# Patient Record
Sex: Male | Born: 1985 | Race: White | Hispanic: No | Smoking: Never smoker
Health system: Southern US, Community
[De-identification: ages and names within clinical notes are randomized; demographics above are authoritative.]

---

## 2016-07-11 ENCOUNTER — Emergency Department
Admission: EM | Admit: 2016-07-11 | Discharge: 2016-07-11 | Disposition: A | Discharge status: Other Acute Inpatient Hospital | Payer: Medicare Other | Attending: Emergency Medicine | Admitting: Emergency Medicine

## 2016-07-11 ENCOUNTER — Emergency Department: Payer: Medicare Other

## 2016-07-11 ENCOUNTER — Encounter: Payer: Self-pay | Admitting: Emergency Medicine

## 2016-07-11 DIAGNOSIS — Z23 Encounter for immunization: Secondary | ICD-10-CM | POA: Diagnosis not present

## 2016-07-11 DIAGNOSIS — Y929 Unspecified place or not applicable: Secondary | ICD-10-CM | POA: Diagnosis not present

## 2016-07-11 DIAGNOSIS — W260XXA Contact with knife, initial encounter: Secondary | ICD-10-CM | POA: Insufficient documentation

## 2016-07-11 DIAGNOSIS — S66809A Unspecified injury of other specified muscles, fascia and tendons at wrist and hand level, unspecified hand, initial encounter: Secondary | ICD-10-CM

## 2016-07-11 DIAGNOSIS — S6992XA Unspecified injury of left wrist, hand and finger(s), initial encounter: Secondary | ICD-10-CM | POA: Diagnosis present

## 2016-07-11 DIAGNOSIS — S66107A Unspecified injury of flexor muscle, fascia and tendon of left little finger at wrist and hand level, initial encounter: Secondary | ICD-10-CM | POA: Diagnosis not present

## 2016-07-11 DIAGNOSIS — Y939 Activity, unspecified: Secondary | ICD-10-CM | POA: Insufficient documentation

## 2016-07-11 DIAGNOSIS — S66105A Unspecified injury of flexor muscle, fascia and tendon of left ring finger at wrist and hand level, initial encounter: Secondary | ICD-10-CM | POA: Diagnosis not present

## 2016-07-11 DIAGNOSIS — Y999 Unspecified external cause status: Secondary | ICD-10-CM | POA: Diagnosis not present

## 2016-07-11 LAB — CBC
HCT: 44.8 % (ref 40.0–52.0)
Hemoglobin: 15.3 g/dL (ref 13.0–18.0)
MCH: 30.4 pg (ref 26.0–34.0)
MCHC: 34.2 g/dL (ref 32.0–36.0)
MCV: 88.9 fL (ref 80.0–100.0)
PLATELETS: 339 10*3/uL (ref 150–440)
RBC: 5.04 MIL/uL (ref 4.40–5.90)
RDW: 13.2 % (ref 11.5–14.5)
WBC: 26 10*3/uL — AB (ref 3.8–10.6)

## 2016-07-11 LAB — BASIC METABOLIC PANEL
Anion gap: 10 (ref 5–15)
BUN: 7 mg/dL (ref 6–20)
CALCIUM: 9.1 mg/dL (ref 8.9–10.3)
CO2: 24 mmol/L (ref 22–32)
Chloride: 102 mmol/L (ref 101–111)
Creatinine, Ser: 0.91 mg/dL (ref 0.61–1.24)
GFR calc Af Amer: >60 mL/min (ref >60)
Glucose, Bld: 137 mg/dL — ABNORMAL HIGH (ref 65–99)
POTASSIUM: 3.7 mmol/L (ref 3.5–5.1)
SODIUM: 136 mmol/L (ref 135–145)

## 2016-07-11 MED ORDER — MORPHINE SULFATE (PF) 4 MG/ML IV SOLN
INTRAVENOUS | Status: AC
  Administered 2016-07-11: 4 mg via INTRAVENOUS
  Filled 2016-07-11: qty 1

## 2016-07-11 MED ORDER — ONDANSETRON HCL 4 MG/2ML IJ SOLN
4.0000 mg | Freq: Once | INTRAMUSCULAR | Status: AC
  Administered 2016-07-11: 4 mg via INTRAVENOUS

## 2016-07-11 MED ORDER — CEFAZOLIN SODIUM-DEXTROSE 1-4 GM/50ML-% IV SOLN
INTRAVENOUS | Status: AC
  Administered 2016-07-11: 1 g via INTRAVENOUS
  Filled 2016-07-11: qty 50

## 2016-07-11 MED ORDER — TETANUS-DIPHTH-ACELL PERTUSSIS 5-2.5-18.5 LF-MCG/0.5 IM SUSP
INTRAMUSCULAR | Status: AC
  Administered 2016-07-11: 0.5 mL via INTRAMUSCULAR
  Filled 2016-07-11: qty 0.5

## 2016-07-11 MED ORDER — MORPHINE SULFATE (PF) 4 MG/ML IV SOLN
4.0000 mg | Freq: Once | INTRAVENOUS | Status: AC
  Administered 2016-07-11: 4 mg via INTRAVENOUS

## 2016-07-11 MED ORDER — CEFAZOLIN SODIUM-DEXTROSE 1-4 GM/50ML-% IV SOLN
1.0000 g | Freq: Once | INTRAVENOUS | Status: AC
  Administered 2016-07-11: 1 g via INTRAVENOUS

## 2016-07-11 MED ORDER — TETANUS-DIPHTH-ACELL PERTUSSIS 5-2.5-18.5 LF-MCG/0.5 IM SUSP
0.5000 mL | Freq: Once | INTRAMUSCULAR | Status: AC
  Administered 2016-07-11: 0.5 mL via INTRAMUSCULAR

## 2016-07-11 MED ORDER — ONDANSETRON HCL 4 MG/2ML IJ SOLN
INTRAMUSCULAR | Status: AC
Start: 2016-07-11 — End: 2016-07-11
  Administered 2016-07-11: 4 mg via INTRAVENOUS
  Filled 2016-07-11: qty 2

## 2016-07-11 MED ORDER — LIDOCAINE HCL (PF) 1 % IJ SOLN
INTRAMUSCULAR | Status: AC
  Administered 2016-07-11: 15 mL via INTRADERMAL
  Filled 2016-07-11: qty 15

## 2016-07-11 MED ORDER — LIDOCAINE HCL (PF) 1 % IJ SOLN
15.0000 mL | Freq: Once | INTRAMUSCULAR | Status: AC
  Administered 2016-07-11: 15 mL via INTRADERMAL
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
## 2016-07-11 NOTE — ED Notes (Signed)
Called Dr. Merlyn Lot, answering service [phone]

## 2016-07-11 NOTE — ED Notes (Signed)
Called [facility], for consult and transfer [phone]

## 2016-07-11 NOTE — ED Notes (Signed)
Called [facility] for consult 7:53a
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
## 2016-07-11 NOTE — ED Notes (Signed)
Officer Margo Aye called c com for transport for patient going to [facility]

## 2016-07-11 NOTE — ED Notes (Signed)
Called [facility] for consult with hand surgery at 8:25

## 2016-07-11 NOTE — ED Notes (Signed)
Magistrate at bedside

## 2018-02-09 IMAGING — DX DG HAND COMPLETE 3+V*L*
3 series · 3 of 3 positions shown · non-contrast
Comparison: No prior.

CLINICAL DATA: Laceration.

EXAM:
LEFT HAND - COMPLETE 3+ VIEW

[hand ap]
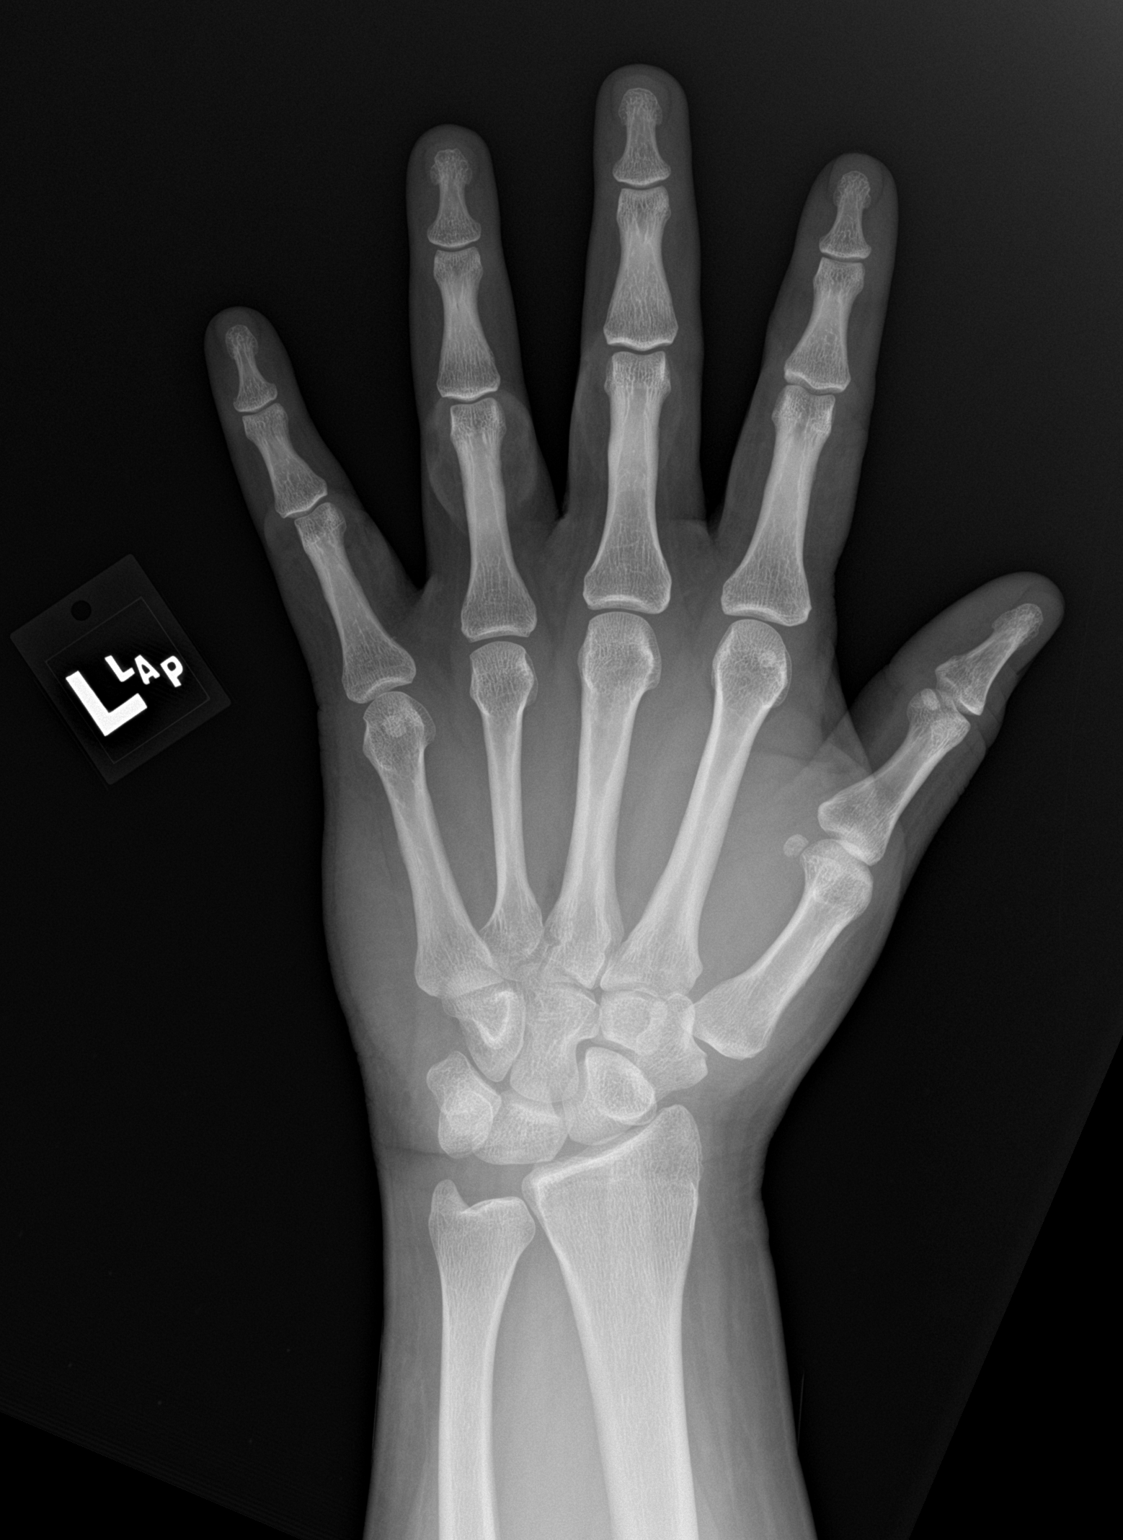

[hand obl]
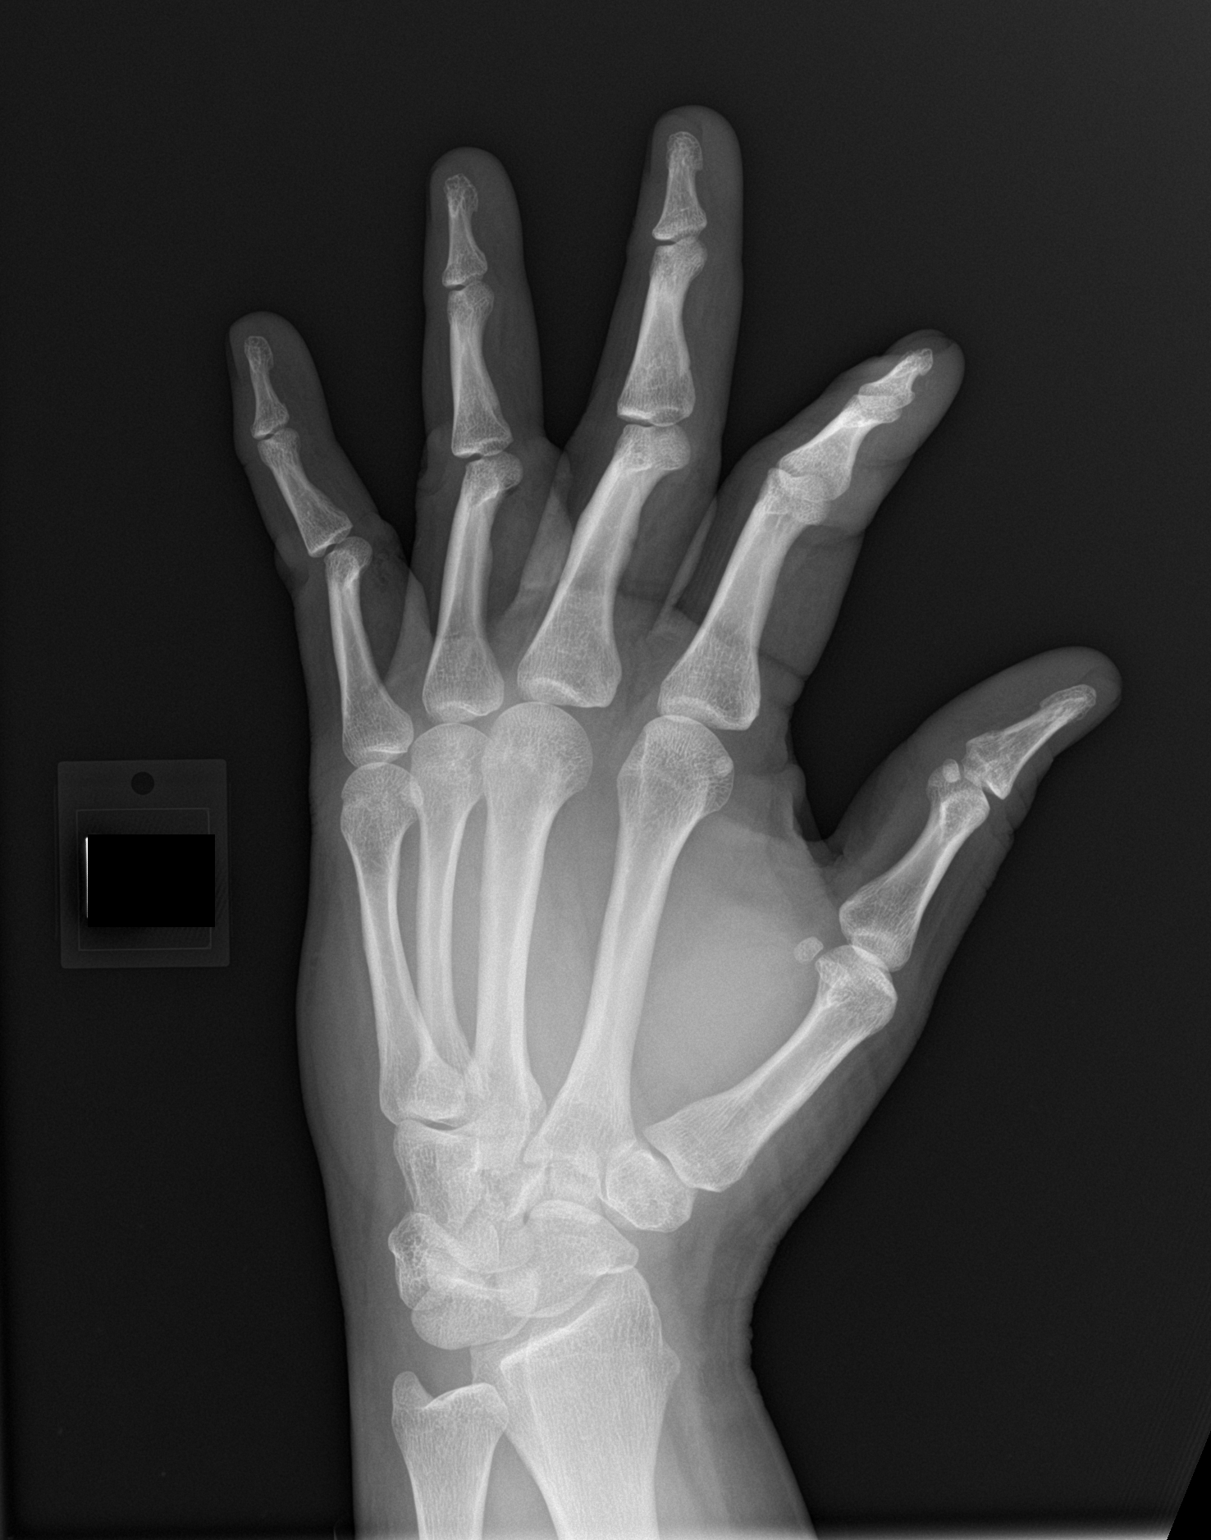

[hand lat]
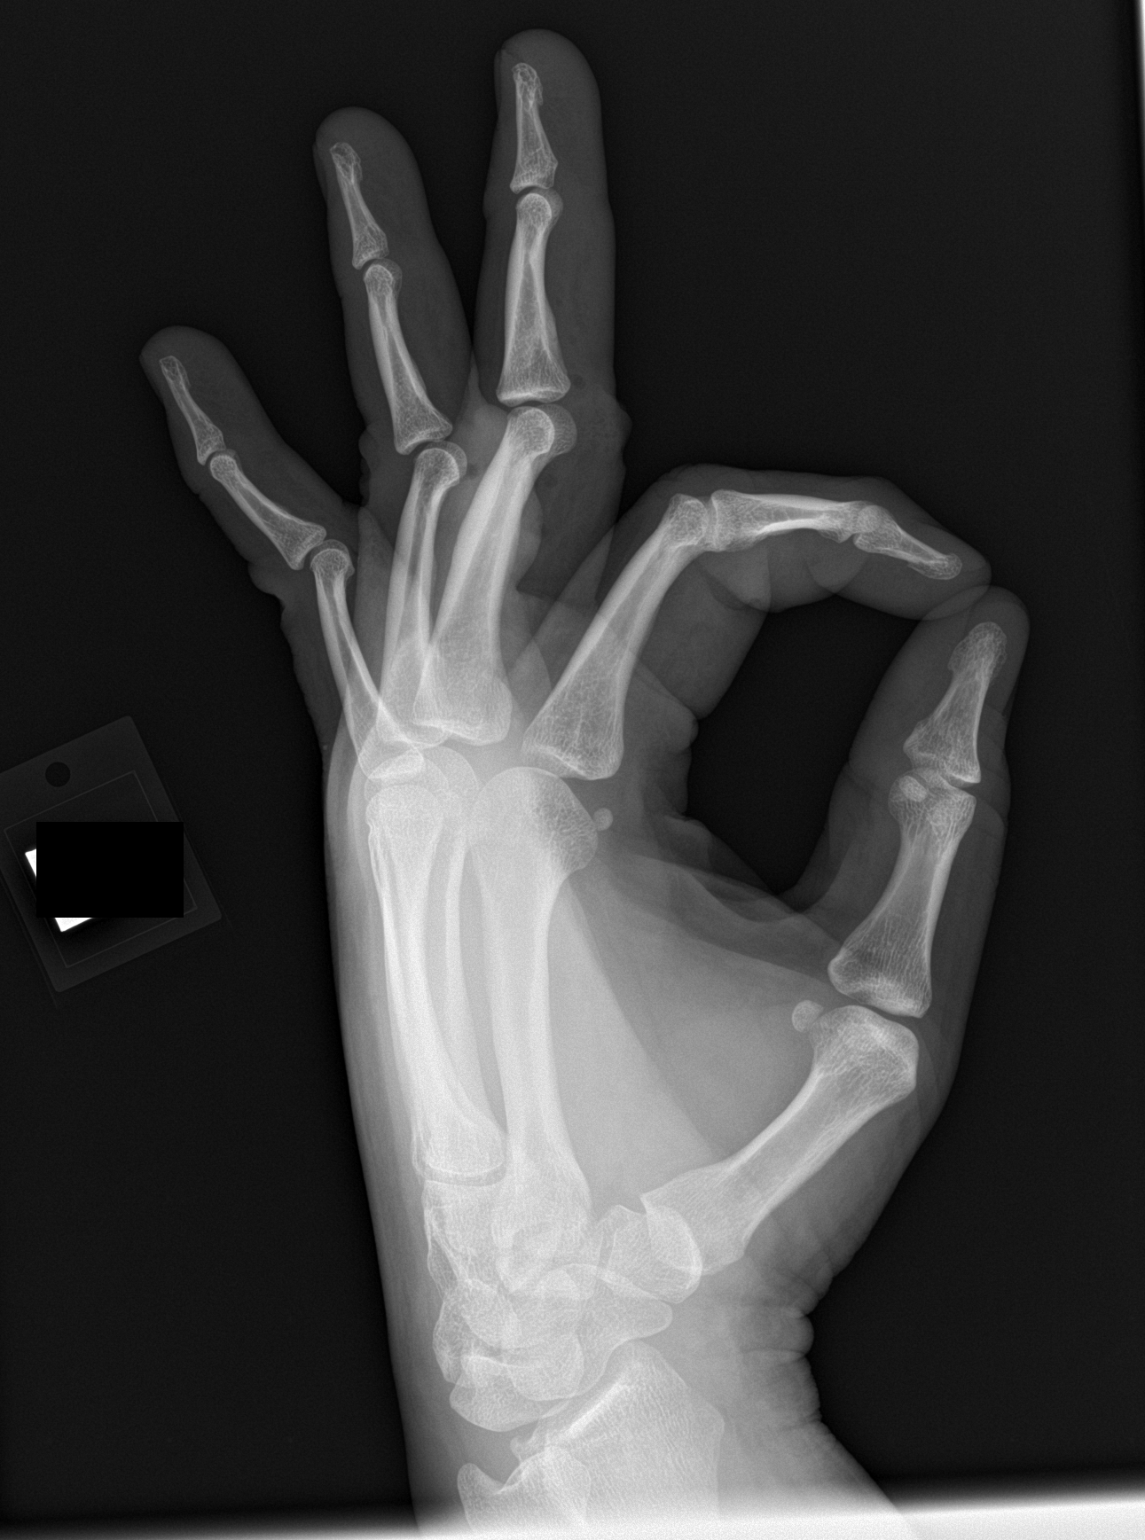

[3 of 3 positions shown; findings below may reference images not displayed]

FINDINGS: Soft tissue lacerations noted over the left third, fourth, and fifth
digits. No radiopaque foreign body. No acute bony abnormality
identified. No evidence of fracture or dislocation.
IMPRESSION: 1. Soft tissue lacerations of the left third, fourth, and fifth
digits. No radiopaque foreign body.

2. No acute bony or joint abnormality.

## 2018-02-09 IMAGING — DX DG HAND COMPLETE 3+V*R*
3 series · 3 of 3 positions shown · non-contrast
Comparison: No prior.

CLINICAL DATA: Laceration

EXAM:
RIGHT HAND - COMPLETE 3+ VIEW

[hand ap]
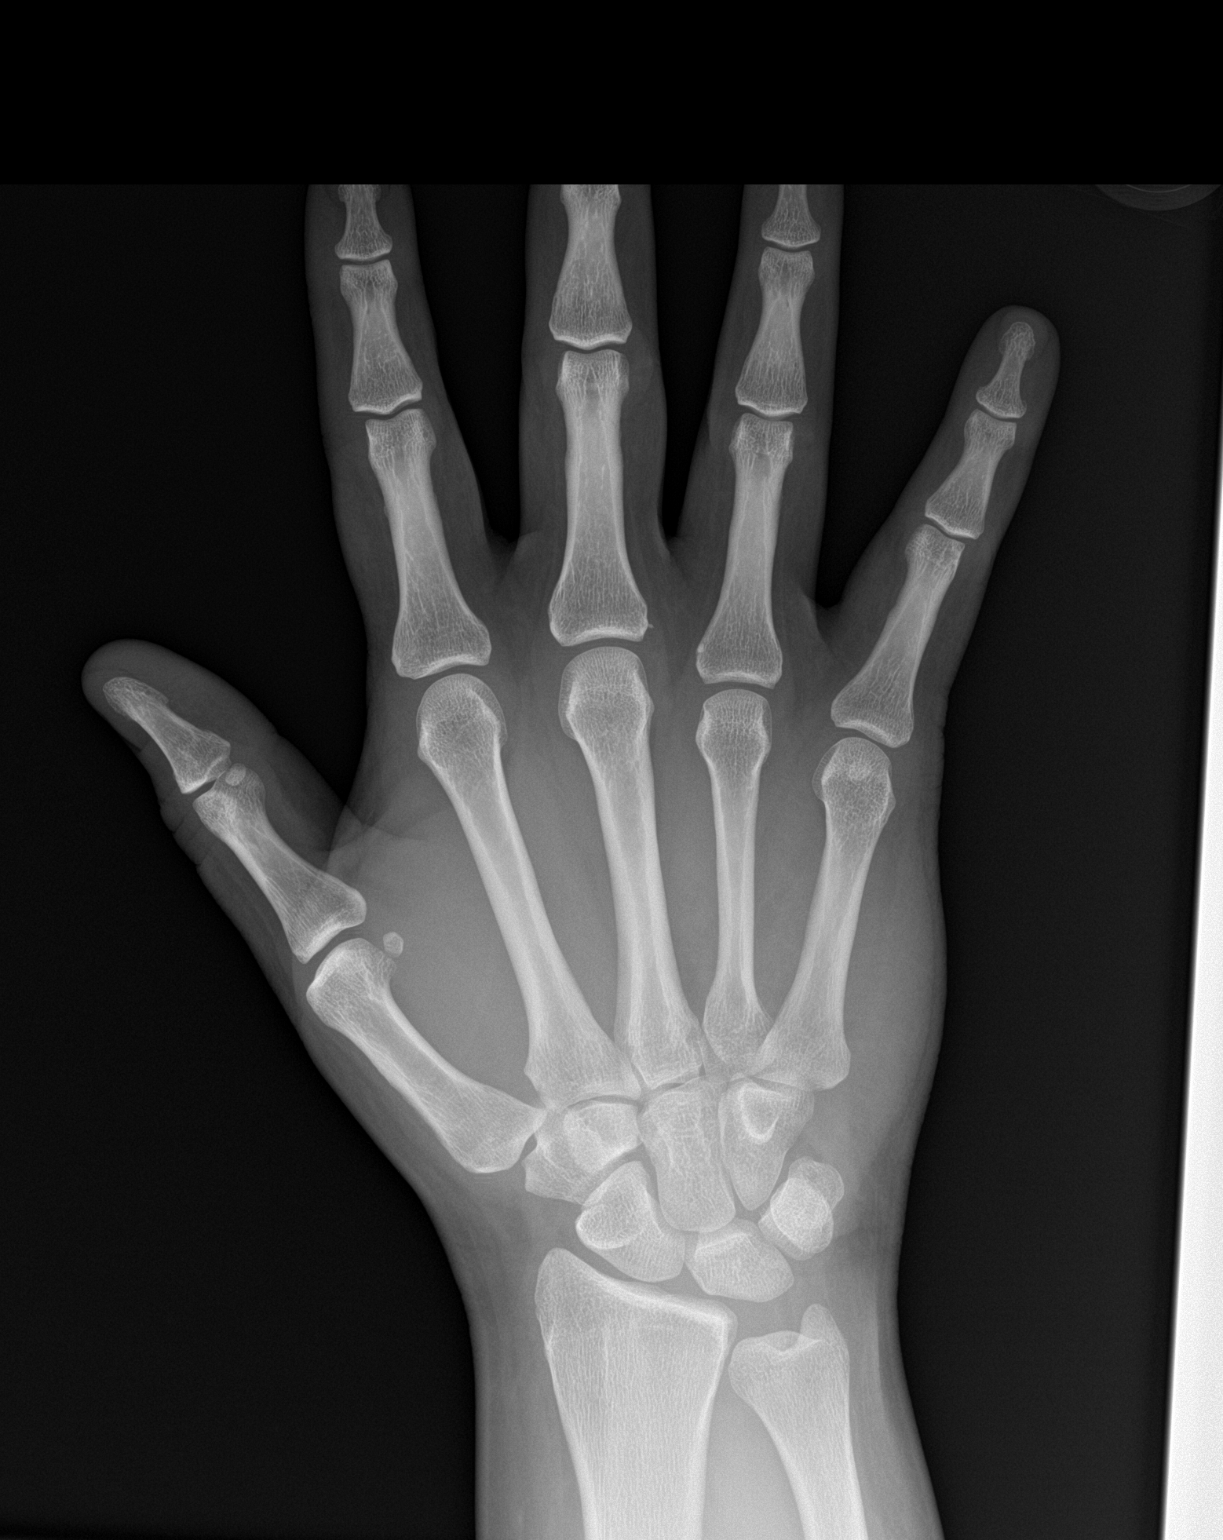

[hand obl]
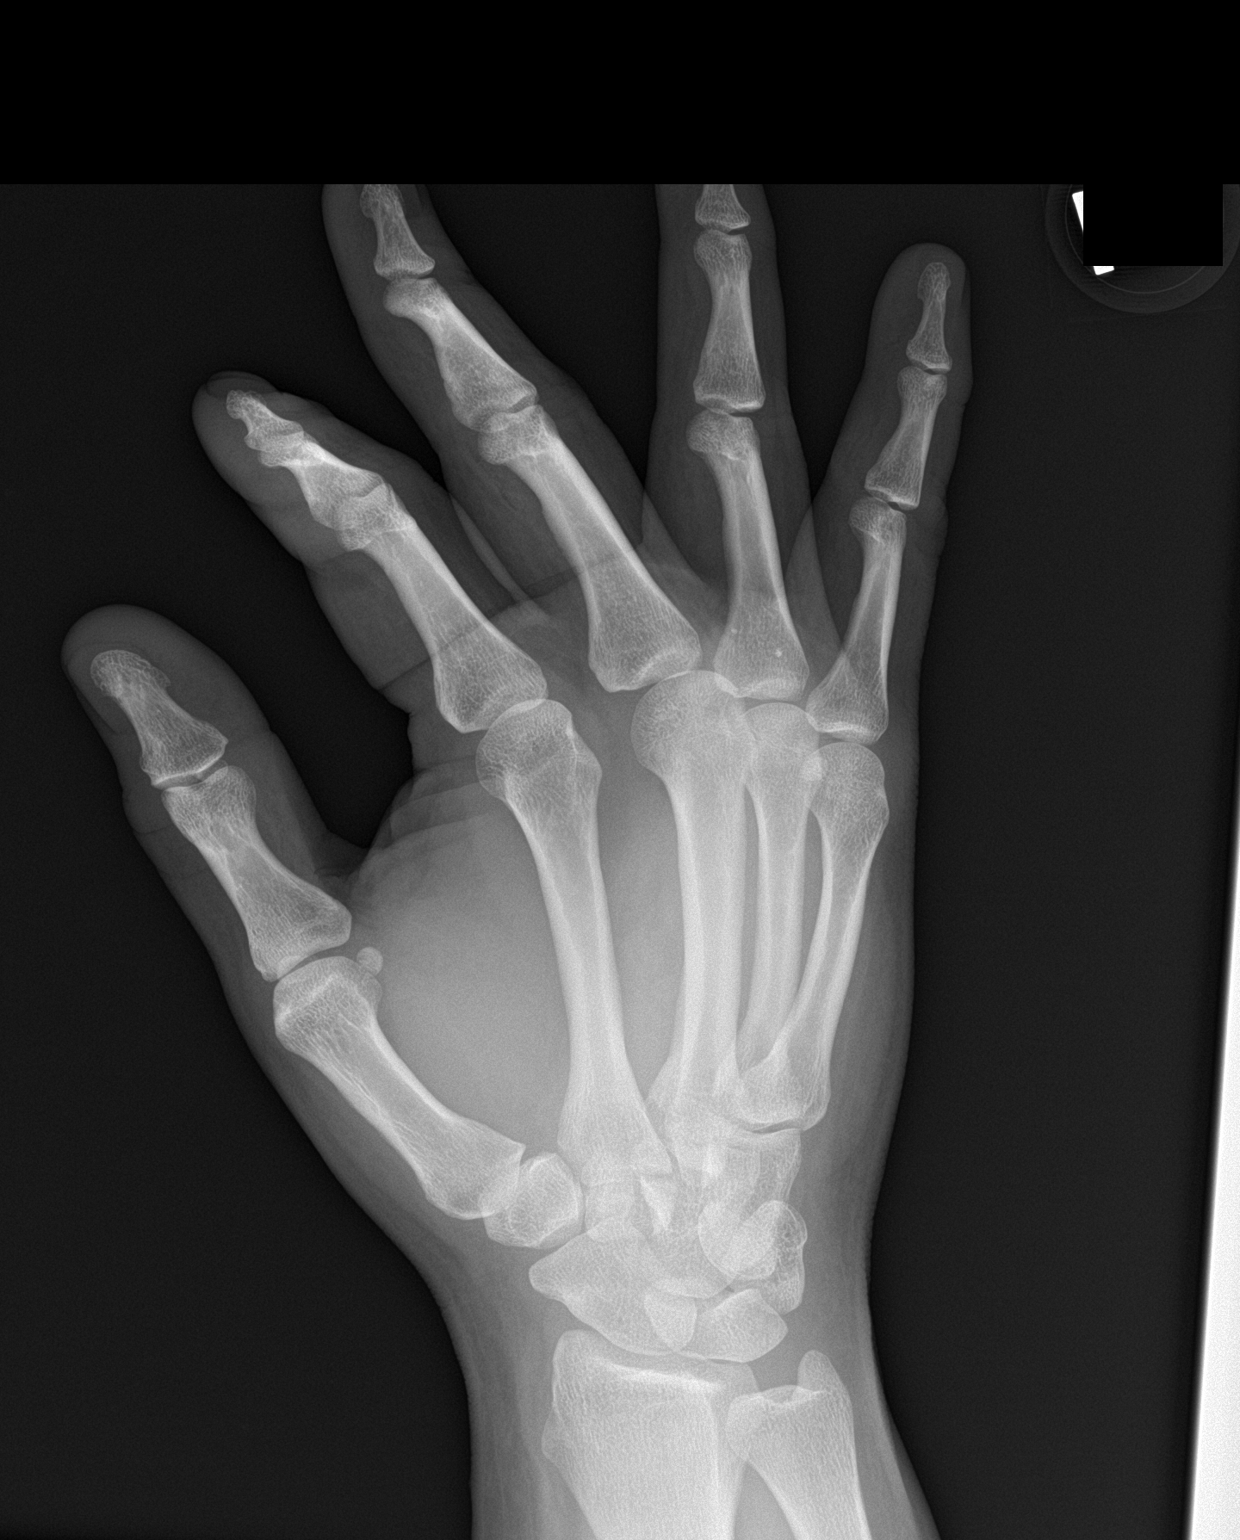

[hand lat]
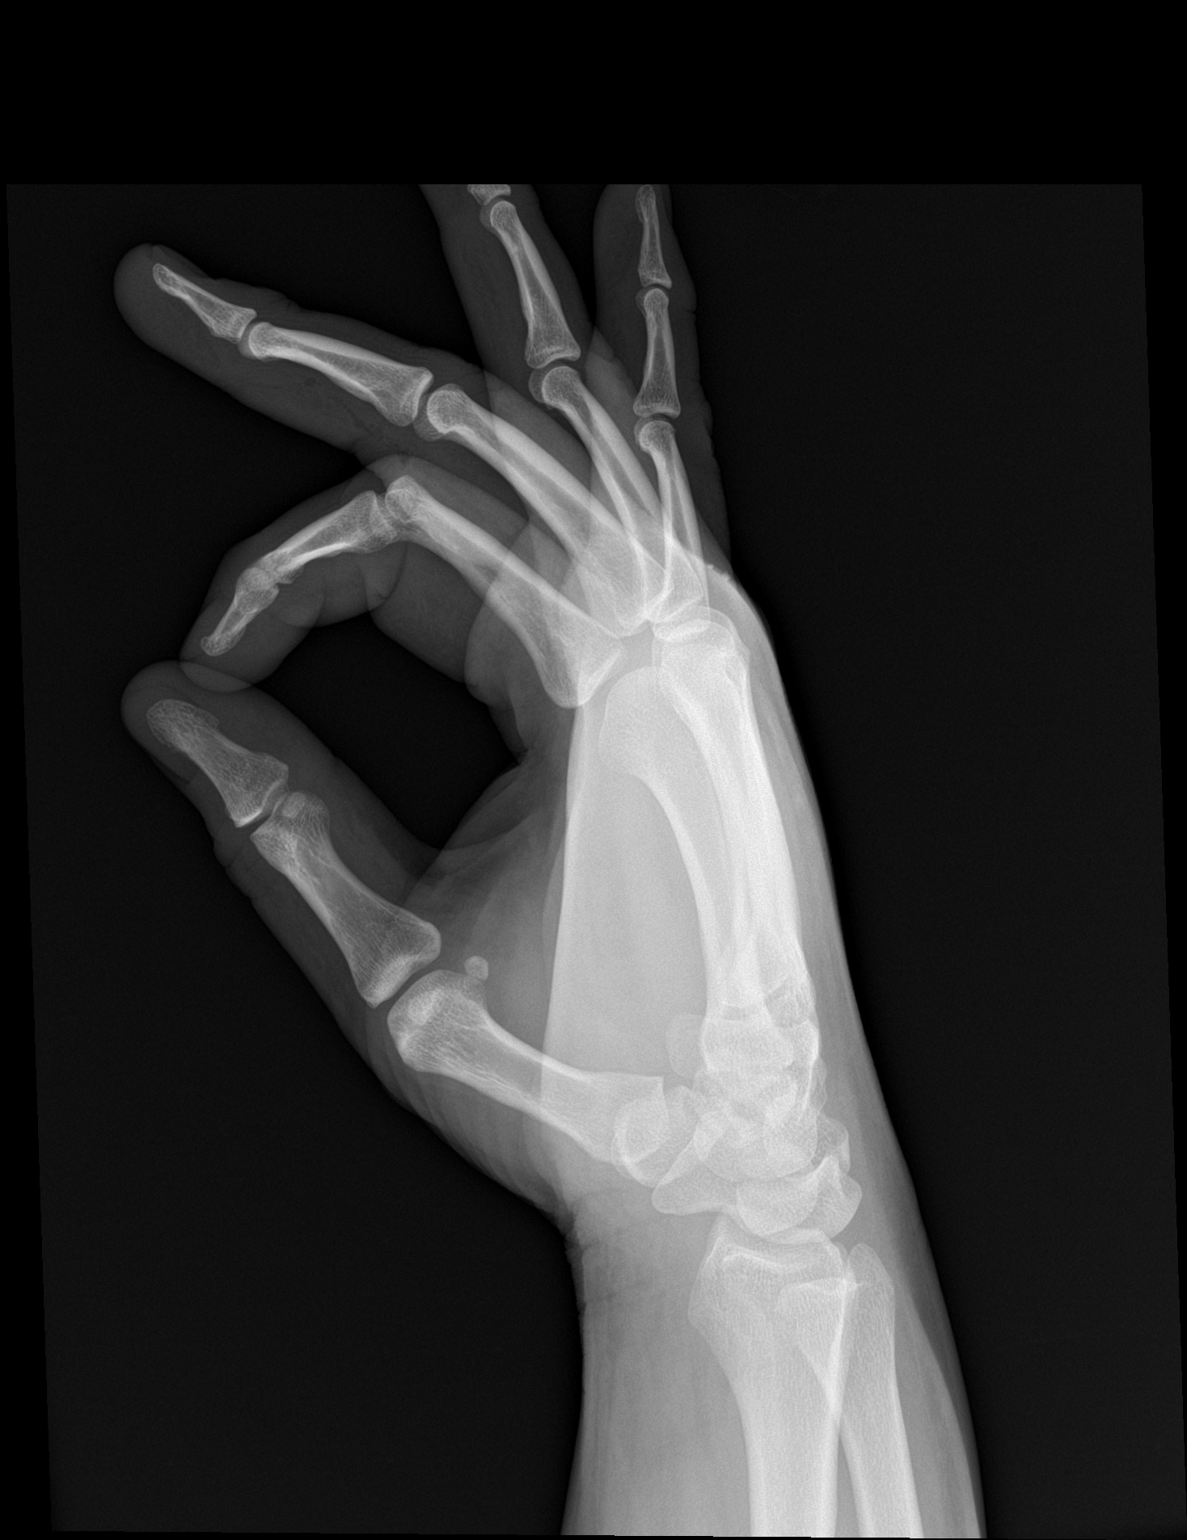

[3 of 3 positions shown; findings below may reference images not displayed]

FINDINGS: Lacerations of the third, fourth, possibly fifth digits are noted.
No radiopaque foreign body. No acute bony abnormality identified. No
evidence of fracture dislocation.
IMPRESSION: Laceration of the third, fourth, possibly fifth digits are noted. No
radiopaque foreign body.

2. No acute bony abnormality.
